# Patient Record
Sex: Male | Born: 1970 | Race: White | Hispanic: No | Marital: Single | Smoking: Current every day smoker
Health system: Southern US, Community
[De-identification: ages and names within clinical notes are randomized; demographics above are authoritative.]

---

## 2016-03-24 ENCOUNTER — Encounter (HOSPITAL_COMMUNITY): Payer: Self-pay | Admitting: *Deleted

## 2016-03-24 ENCOUNTER — Emergency Department (HOSPITAL_COMMUNITY)
Admission: EM | Admit: 2016-03-24 | Discharge: 2016-03-24 | Disposition: A | Discharge status: Other Acute Inpatient Hospital | Payer: Self-pay | Attending: Emergency Medicine | Admitting: Emergency Medicine

## 2016-03-24 ENCOUNTER — Emergency Department (HOSPITAL_COMMUNITY): Payer: Self-pay

## 2016-03-24 DIAGNOSIS — Z23 Encounter for immunization: Secondary | ICD-10-CM | POA: Insufficient documentation

## 2016-03-24 DIAGNOSIS — S62639B Displaced fracture of distal phalanx of unspecified finger, initial encounter for open fracture: Secondary | ICD-10-CM

## 2016-03-24 DIAGNOSIS — S62633B Displaced fracture of distal phalanx of left middle finger, initial encounter for open fracture: Secondary | ICD-10-CM | POA: Insufficient documentation

## 2016-03-24 DIAGNOSIS — Y929 Unspecified place or not applicable: Secondary | ICD-10-CM | POA: Insufficient documentation

## 2016-03-24 DIAGNOSIS — W228XXA Striking against or struck by other objects, initial encounter: Secondary | ICD-10-CM | POA: Insufficient documentation

## 2016-03-24 DIAGNOSIS — Z880 Allergy status to penicillin: Secondary | ICD-10-CM | POA: Insufficient documentation

## 2016-03-24 DIAGNOSIS — Y9389 Activity, other specified: Secondary | ICD-10-CM | POA: Insufficient documentation

## 2016-03-24 DIAGNOSIS — Y990 Civilian activity done for income or pay: Secondary | ICD-10-CM | POA: Insufficient documentation

## 2016-03-24 MED ORDER — LIDOCAINE HCL (PF) 1 % IJ SOLN
30.0000 mL | Freq: Once | INTRAMUSCULAR | Status: AC
  Administered 2016-03-24: 30 mL
  Filled 2016-03-24: qty 30

## 2016-03-24 MED ORDER — OXYCODONE-ACETAMINOPHEN 5-325 MG PO TABS: 1.0000 | ORAL_TABLET | ORAL | Status: AC | PRN

## 2016-03-24 MED ORDER — VANCOMYCIN HCL IN DEXTROSE 1-5 GM/200ML-% IV SOLN
1000.0000 mg | Freq: Once | INTRAVENOUS | Status: AC
  Administered 2016-03-24: 1000 mg via INTRAVENOUS
  Filled 2016-03-24: qty 200

## 2016-03-24 MED ORDER — BUPIVACAINE HCL (PF) 0.25 % IJ SOLN
30.0000 mL | Freq: Once | INTRAMUSCULAR | Status: AC
  Administered 2016-03-24: 30 mL

## 2016-03-24 MED ORDER — FENTANYL CITRATE (PF) 100 MCG/2ML IJ SOLN
50.0000 ug | Freq: Once | INTRAMUSCULAR | Status: AC
  Administered 2016-03-24: 50 ug via INTRAMUSCULAR
  Filled 2016-03-24: qty 2

## 2016-03-24 MED ORDER — BUPIVACAINE HCL (PF) 0.5 % IJ SOLN
10.0000 mL | Freq: Once | INTRAMUSCULAR | Status: AC
  Administered 2016-03-24: 10 mL
  Filled 2016-03-24: qty 30

## 2016-03-24 MED ORDER — FENTANYL CITRATE (PF) 100 MCG/2ML IJ SOLN
50.0000 ug | Freq: Once | INTRAMUSCULAR | Status: AC
  Administered 2016-03-24: 50 ug via INTRAVENOUS
  Filled 2016-03-24: qty 2

## 2016-03-24 MED ORDER — TETANUS-DIPHTH-ACELL PERTUSSIS 5-2.5-18.5 LF-MCG/0.5 IM SUSP
0.5000 mL | Freq: Once | INTRAMUSCULAR | Status: AC
  Administered 2016-03-24: 0.5 mL via INTRAMUSCULAR
  Filled 2016-03-24: qty 0.5

## 2016-03-24 MED ORDER — SULFAMETHOXAZOLE-TRIMETHOPRIM 800-160 MG PO TABS: 1.0000 | ORAL_TABLET | Freq: Two times a day (BID) | ORAL | Status: AC

## 2016-03-24 NOTE — ED Provider Notes (Signed)
Tilda Burrowaron Perko is a 45 y.o. male who presents to the Emergency Department from evaluation at [facility] ED after a left third finger injury that occurred earlier today. Patient reports that he was at work and was throwing out [employer] benches when one of the benches landed on the another bench causing the piece to bounce upward, striking him in the hand. He states that he lacerated the tip of his finger followed immediately by pain. He has received left middle finger imaging positive for a displaced distal phalanx fracture and F[facility] and [facility] to be seen by Dr. Merlyn LotKuzma and receive vancomycin.  5:21 PM Ordered vancomycin. Will consult with Dr. Merlyn LotKuzma.  6:53 PM Paged Dr. Merlyn LotKuzma twice, with no response.  7:04 PM Dr. Merlyn LotKuzma with pt. Spoke with Dr. Merlyn LotKuzma who will perform procedure in FT. Will order 0.25% Marcaine and 1% Lidocaine.  Patient discharged with percocet and Bactrim.  FU Dr. Merrilee SeashoreKuzma's office in 1 week.  Discussed return precautions.  Patient agrees and acknowledges the above plan for discharge.       Cheri FowlerKayla Jamile Rekowski, PA-C 03/24/16 2033  Lavera Guiseana Duo Liu, MD 03/25/16 [id number]

## 2016-03-24 NOTE — ED Notes (Signed)
Pt states he cut his finger between a steel bench today at 1245PM. Pt applied pressure to wound, states bleeding was minimal.

## 2016-03-24 NOTE — ED Notes (Signed)
Notified pharmacy that missing marcaine

## 2016-03-24 NOTE — Discharge Instructions (Signed)
Finger Fracture  Fractures of fingers are breaks in the bones of the fingers. There are many types of fractures. There are different ways of treating these fractures. Your health care provider will discuss the best way to treat your fracture.  CAUSES  Traumatic injury is the main cause of broken fingers. These include:  · Injuries while playing sports.  · Workplace injuries.  · Falls.  RISK FACTORS  Activities that can increase your risk of finger fractures include:  · Sports.  · Workplace activities that involve machinery.  · A condition called osteoporosis, which can make your bones less dense and cause them to fracture more easily.  SIGNS AND SYMPTOMS  The main symptoms of a broken finger are pain and swelling within 15 minutes after the injury. Other symptoms include:  · Bruising of your finger.  · Stiffness of your finger.  · Numbness of your finger.  · Exposed bones (compound fracture) if the fracture is severe.  DIAGNOSIS   The best way to diagnose a broken bone is with X-ray imaging. Additionally, your health care provider will use this X-ray image to evaluate the position of the broken finger bones.   TREATMENT   Finger fractures can be treated with:   · Nonreduction--This means the bones are in place. The finger is splinted without changing the positions of the bone pieces. The splint is usually left on for about a week to 10 days. This will depend on your fracture and what your health care provider thinks.  · Closed reduction--The bones are put back into position without using surgery. The finger is then splinted.  · Open reduction and internal fixation--The fracture site is opened. Then the bone pieces are fixed into place with pins or some type of hardware. This is seldom required. It depends on the severity of the fracture.  HOME CARE INSTRUCTIONS   · Follow your health care provider's instructions regarding activities, exercises, and physical therapy.  · Only take over-the-counter or prescription  medicines for pain, discomfort, or fever as directed by your health care provider.  SEEK MEDICAL CARE IF:  You have pain or swelling that limits the motion or use of your fingers.  SEEK IMMEDIATE MEDICAL CARE IF:   Your finger becomes numb.  MAKE SURE YOU:   · Understand these instructions.  · Will watch your condition.  · Will get help right away if you are not doing well or get worse.     This information is not intended to replace advice given to you by your health care provider. Make sure you discuss any questions you have with your health care provider.     Document Released: 03/01/2001 Document Revised: 09/07/2013 Document Reviewed: 06/29/2013  Elsevier Interactive Patient Education ©2016 [employer]

## 2016-03-24 NOTE — ED Notes (Signed)
I spoke with Dr Merlyn Lotkuzma who said he will come to ED to see pt once he completes his current case, approx 20-30 mintues

## 2016-03-24 NOTE — ED Notes (Signed)
Pt's friend, Eliezer LoftsQuinn Kellum, phone number upon discharge: [phone]

## 2016-03-24 NOTE — ED Provider Notes (Signed)
CSN: [id number]     Arrival date & time 03/24/16  1317 History  By signing my name below, I, [name], attest that this documentation has been prepared under the direction and in the presence of [name], PA-C. Electronically Signed: [name], ED Scribe. 03/24/2016. 2:22 PM.   Chief Complaint  Patient presents with  . Finger Injury   The history is provided by the patient. No language interpreter was used.   HPI Comments: Jose Terry is a 45 y.o. male who is otherwise healthy presents to the Emergency Department complaining of a moderate laceration with controlled bleeding to the padding of his left middle finger x 1 hour. Pt states that he was disassembling large metal benches and while throwing the metal scraps away a large piece bounced back and caught the finger which caused his laceration. He reports associated, mild, swelling, pain and bruising surrounding the wound. His last TDAP booster was ~6 years ago. Pt denies any other associated symptoms at this time.    No past medical history on file. No past surgical history on file. No family history on file. Social History  Substance Use Topics  . Smoking status: Not on file  . Smokeless tobacco: Not on file  . Alcohol Use: Not on file    Review of Systems A complete 10 system review of systems was obtained and all systems are negative except as noted in the HPI and PMH.    Allergies  Review of patient's allergies indicates not on file.  Home Medications   Prior to Admission medications   Not on File   BP 106/73 mmHg  Pulse 88  Temp(Src) 97.5 F (36.4 C) (Oral)  Resp 18  SpO2 100%    Physical Exam  Constitutional: He is oriented to person, place, and time. He appears well-developed and well-nourished.  HENT:  Head: Normocephalic and atraumatic.  Eyes: EOM are normal.  Neck: Normal range of motion.  Cardiovascular: Normal rate.   Pulmonary/Chest: Effort normal. No respiratory distress.   Abdominal: Soft.  Musculoskeletal: Normal range of motion.  Neurological: He is alert and oriented to person, place, and time.  Skin: Skin is warm and dry.  Deep laceration of distal tip of left middle finger spanning finger pad into nail bed with partial nail bed evulsion; no foreign bodies seen or palpated; laceration is a total of 3 cm long; no evidence of tendon injury (images below)  Psychiatric: He has a normal mood and affect.  Nursing note and vitals reviewed.      ED Course  Procedures  DIAGNOSTIC STUDIES: Oxygen Saturation is 100% on RA, normal by my interpretation.    COORDINATION OF CARE: 1:56 PM Discussed next steps with pt. He verbalized understanding and is agreeable with the plan.   Labs Review Labs Reviewed - No data to display  Imaging Review Dg Hand Complete Left  03/24/2016  CLINICAL DATA:  Laceration tip of distal left middle finger between a steel bench EXAM: LEFT HAND - COMPLETE 3+ VIEW COMPARISON:  None. FINDINGS: Three views of the left hand submitted. There is mild displaced fracture at the tip of distal phalanx third finger with associated soft tissue injury. Open fracture is highly suspected. IMPRESSION: There is mild displaced fracture at the tip of distal phalanx third finger with associated soft tissue injury. Open fracture is highly suspected. Electronically Signed   By: Natasha MeadLiviu  Pop M.D.   On: 03/24/2016 14:51   I have personally reviewed and evaluated these images  as part of my medical decision-making.   EKG Interpretation None      MDM   Final diagnoses:  Open fracture of finger, distal phalanx, initial encounter   Pt presents with open fracture of distal phalynx of left middle finger. Xray reveals mild displaced fx. Overlying deep laceration with partial nail bed avulsion present. Spoke with Dr. Merlyn Lot with hand surgery who recommends transfer to [facility] for surgical repair. Pt is going by POV, will not place IV at this time. Pt has  anaphylactic allergy to PCN, will defer Ancef at this time. Wound was cleaned and bandaged in ED. Tdap updated. Pain managed in ED with Fentanyl.  Spoke with Dr. Donnald Garre at [facility] ED who will accept pt. Pt will need saline lock IV and vancomycin upon [facility] arrival. Dr. Elenor Quinones will see pt in [facility].    Case discussed with Dr. Criss Alvine who agrees with treatment plan.  I personally performed the services described in this documentation, which was scribed in my presence. The recorded information has been reviewed and is accurate.     [name], PA-C 03/24/16 1617  Pricilla Loveless, MD 03/27/16 [phone]

## 2016-03-24 NOTE — ED Notes (Signed)
Hand surgeon at bedside. 

## 2016-03-26 NOTE — Consult Note (Addendum)
Jose Terry is an 45 y.o. male.   Late entry (03/26/16) Chief Complaint: left long fingertip crush HPI: 45 yo rhd male states he smashed left long fingertip between two benches as he was throwing them into a pile for disposal.  This occurred at approximately noon.  Noted pain in tip of long finger with associated laceration, swelling, and ecchymosis.    Case [facility] with [facility], [facility] and her noted from 03/24/16 was reviewed. Xrays viewed and interpreted by me: AP, lateral, oblique views of left long finger show distal phalanx tuft fracture. Labs reviewed: none  Allergies:  Allergies  Allergen Reactions  . Penicillins Anaphylaxis    Has patient had a PCN reaction causing immediate rash, facial/tongue/throat swelling, SOB or lightheadedness with hypotension: Yes Has patient had a PCN reaction causing severe rash involving mucus membranes or skin necrosis: No Has patient had a PCN reaction that required hospitalization Unknown Has patient had a PCN reaction occurring within the last 10 years: unknown If all of the above answers are "NO", then may proceed with Cephalosporin use.     History reviewed. No pertinent past medical history.  History reviewed. No pertinent past surgical history.  Family History: No family history on file.  Social History:   reports that he has been smoking.  He does not have any smokeless tobacco history on file. He reports that he does not drink alcohol. His drug history is not on file.  Medications:  (Not in a hospital admission)  No results found for this or any previous visit (from the past 48 hour(s)).  No results found.   A comprehensive review of systems was negative. Review of Systems: No fevers, chills, night sweats, chest pain, shortness of breath, nausea, vomiting, diarrhea, constipation, easy bleeding or bruising, headaches, dizziness, vision changes, fainting.   Blood pressure 102/69, pulse 75, temperature 97.5 F (36.4 C),  temperature source Oral, resp. rate 17, SpO2 100 %.  General appearance: alert, cooperative and appears stated age Head: Normocephalic, without obvious abnormality, atraumatic Neck: supple, symmetrical, trachea midline Extremities: Intact sensation and capillary refill all digits.  Radial side of left long with decreased sensation.  +epl/fpl/io.  Wound on left long finger on radial side of distal phalanx with nail involvement.  Able to flex and extend dip joint. Bone exposed in wound. Pulses: 2+ and symmetric Skin: Skin color, texture, turgor normal. No rashes or lesions Neurologic: Grossly normal Incision/Wound: As above  Assessment/Plan Left long fingertip crush injury with open distal phalanx fracture and skin and nail be lacerations.  Recommend irrigation and debridement, reduction, and repair of skin and nail bed under digital block in ED.  Risks, benefits, and alternatives of surgery were discussed and the patient agrees with the plan of care.  PROCEDURE NOTE:  Digital block performed with 10 mL half and half 0.25% plain marcaine and 1% plain lidocaine.  Adequate to give digital anesthesia to finger.  Wound copiously irrigated with 1000 ml sterile saline and betadine solution.  Finger prepped with betadine and sterilely draped.  Penrose used as tourniquet and was up approximately 20 minutes.  Nail removed with freer elevator.  Hematoma cleaned from wound.  Open distal phalanx fracture reduced.  Skin reapproximated with 5-0 monocryl suture.  Nail bed reapproximated with 6-0 chromic suture.  Xeroform placed in nail fold and wounds dressed with xeroform, 4x4's, and lightly wrapped with coban dressing.  alumafoam splint placed and wrapped lightly with coban dressing.  Penrose removed.  He tolerated the procedure well.  Follow up in office 1 week.  Pain meds and antibiotics per ED.   Jose Terry R 03/26/2016, 5:32 PM   Addendum (04/02/16): addended for clarity of fracture type.

## 2017-03-18 IMAGING — DX DG HAND COMPLETE 3+V*L*
3 series · 3 of 3 positions shown · non-contrast
Comparison: None.

CLINICAL DATA: Laceration tip of distal left middle finger between
a steel bench

EXAM:
LEFT HAND - COMPLETE 3+ VIEW

[hand pa]
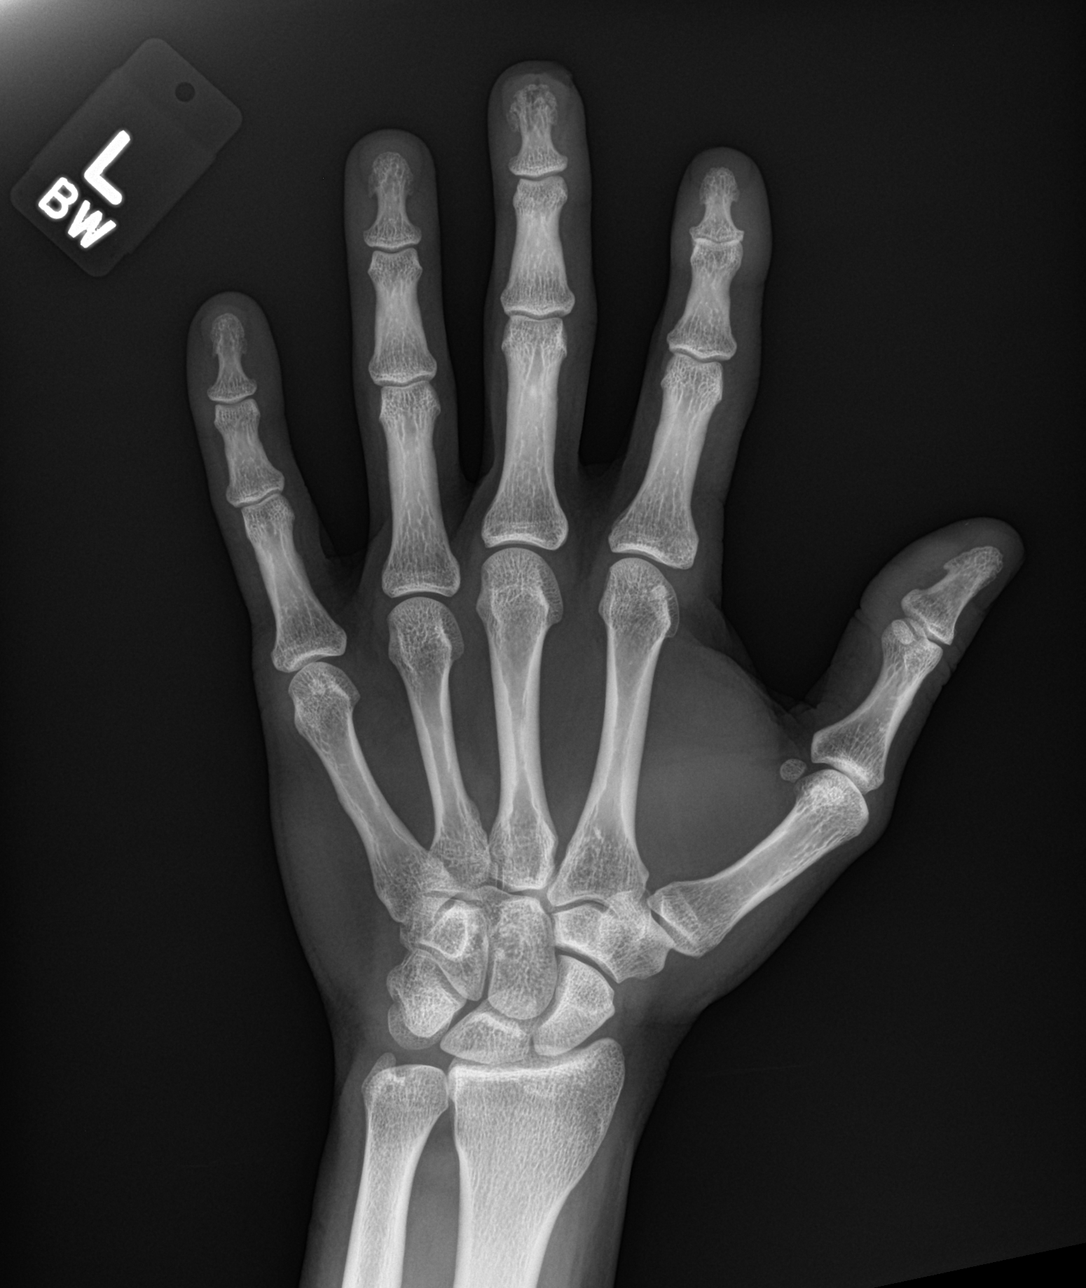

[hand obl]
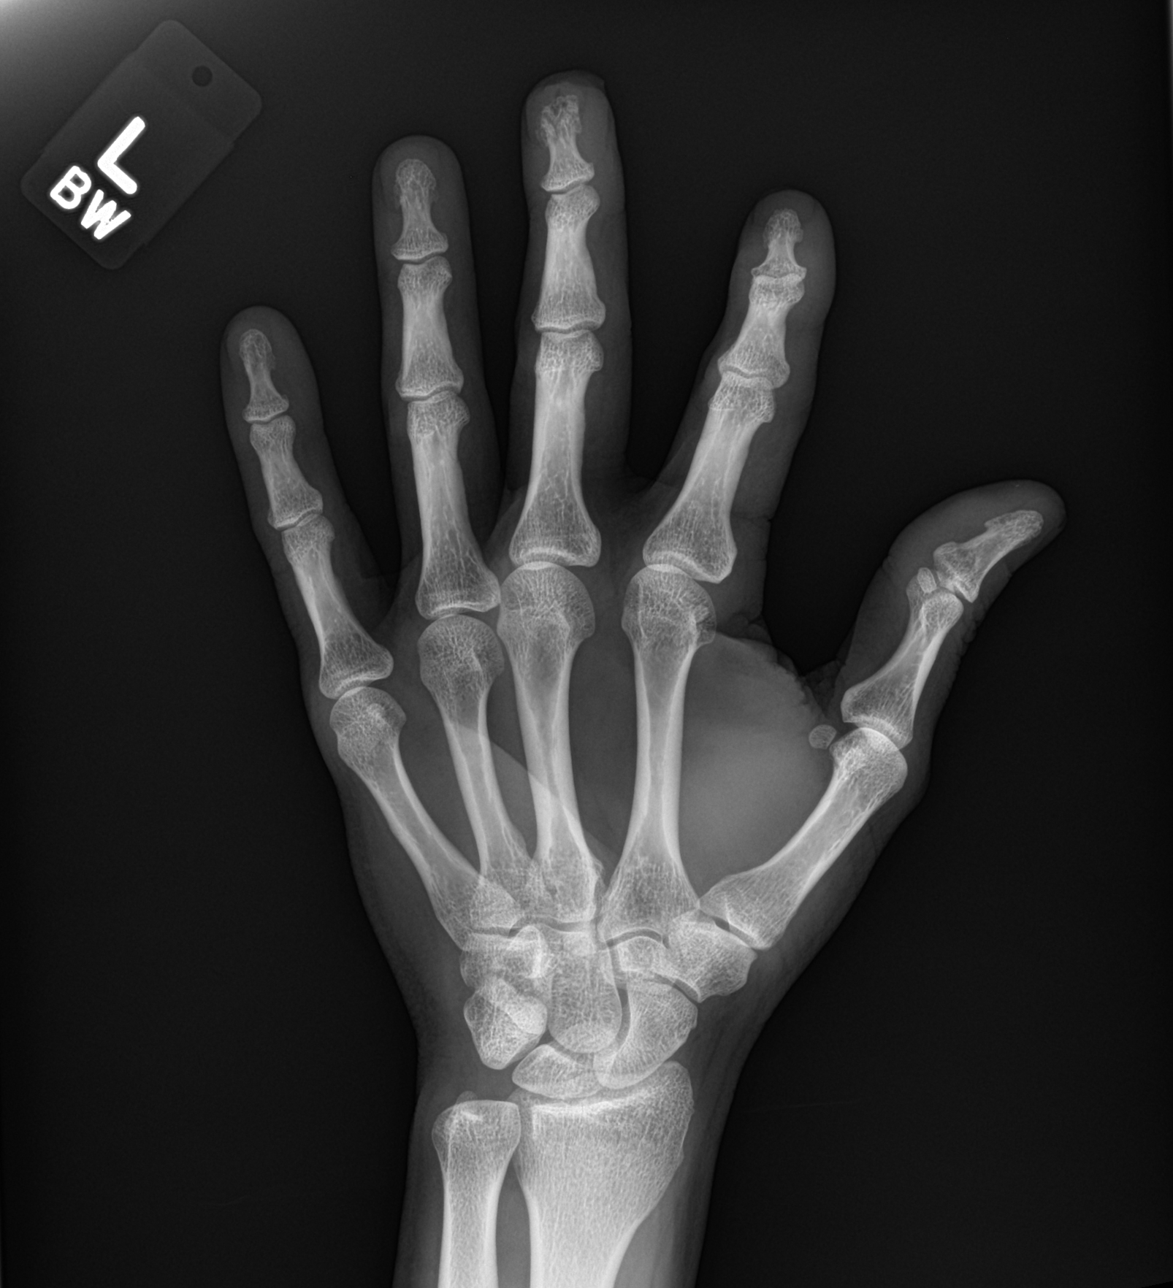

[hand lat]
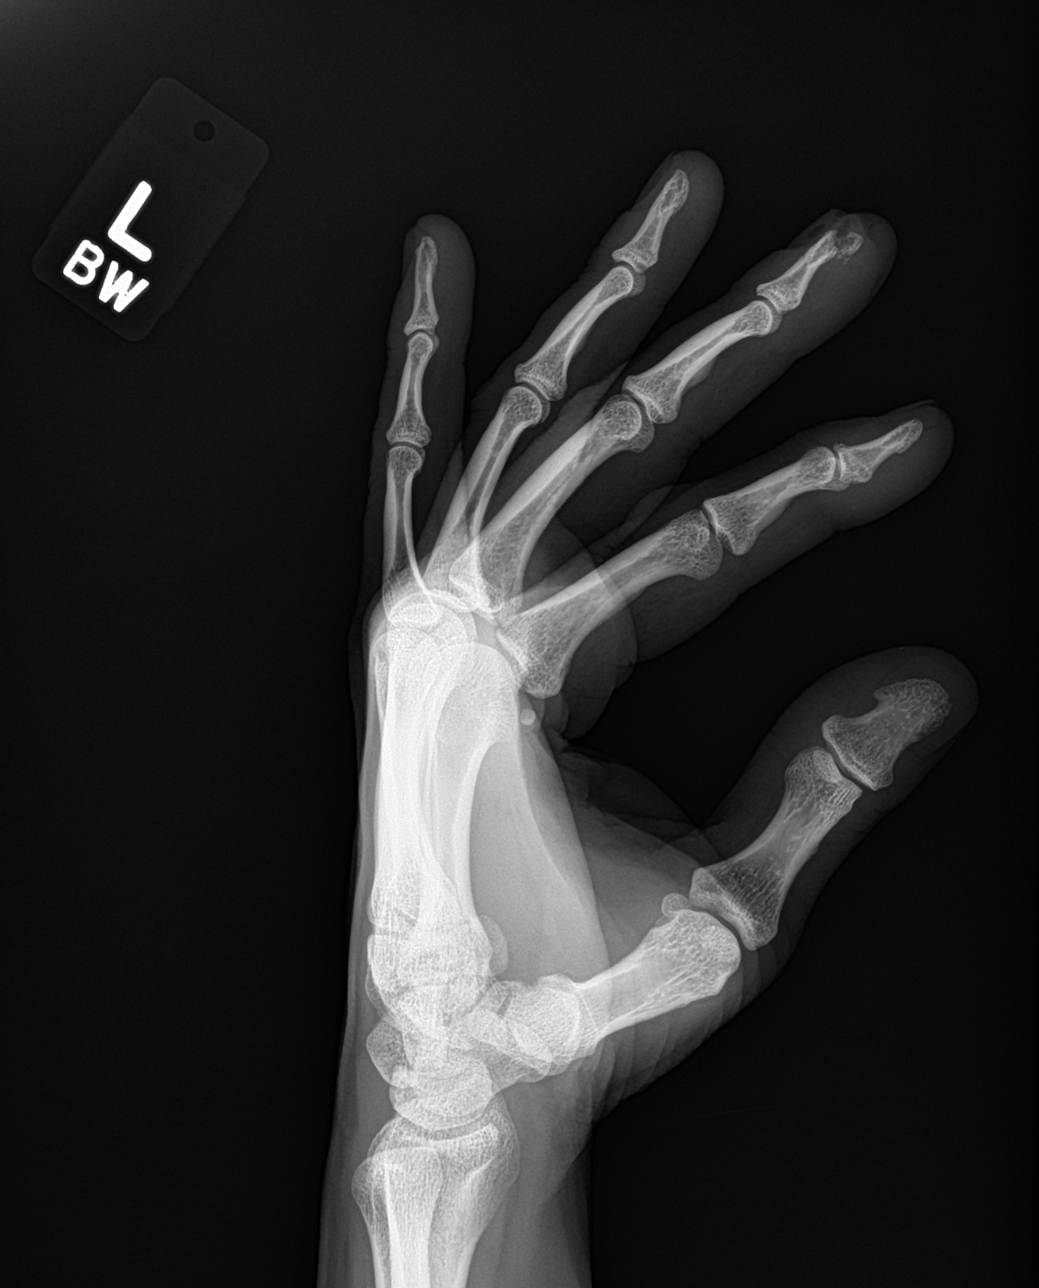

[3 of 3 positions shown; findings below may reference images not displayed]

FINDINGS: Three views of the left hand submitted. There is mild displaced
fracture at the tip of distal phalanx third finger with associated
soft tissue injury. Open fracture is highly suspected.
IMPRESSION: There is mild displaced fracture at the tip of distal phalanx third
finger with associated soft tissue injury. Open fracture is highly
suspected.
